# Patient Record
Sex: Female | Born: 1990 | Race: Black or African American | Hispanic: No | Marital: Single | Smoking: Current every day smoker
Health system: Southern US, Community
[De-identification: ages and names within clinical notes are randomized; demographics above are authoritative.]

## PROBLEM LIST (undated history)

## (undated) DIAGNOSIS — R519 Headache, unspecified: Secondary | ICD-10-CM

## (undated) DIAGNOSIS — R51 Headache: Secondary | ICD-10-CM

## (undated) HISTORY — DX: Headache: R51

## (undated) HISTORY — PX: WISDOM TOOTH EXTRACTION: SHX21

## (undated) HISTORY — DX: Headache, unspecified: R51.9

---

## 2018-07-23 NOTE — Congregational Nurse Program (Signed)
Congregational Nurse Program Note  Date of Encounter: 07/23/2018  Past Medical History: No past medical history on file.  Encounter Details: CNP Questionnaire - 08/06/18 1800      Questionnaire   Patient Status  Not Applicable    Race  Black or African American    Location Patient Served At  [facility]    Insurance  Private Insurance    Uninsured  Not Applicable    Food  No food insecurities    Housing/Utilities  Yes, have permanent housing    Transportation  No transportation needs    Interpersonal Safety  Yes, feel physically and emotionally safe where you currently live    Medication  No medication insecurities    Medical Provider  Yes    Referrals  Urgent Care    ED Visit Averted  Yes    Life-Saving Intervention Made  Not Applicable       Client had some personal concerns and needed someone to listen and give some encouragement. I provided this as she needed to express herself. I expressed to her that  I was available and when ever she needs to talk. [address]Duey Liller RN BSN CN [facility]  PhD.. [phone]. Spiritual Care.Marland Kitchen..Marland Kitchen

## 2018-08-06 NOTE — Congregational Nurse Program (Signed)
Congregational Nurse Program Note  Date of Encounter: 08/04/2018  Past Medical History: No past medical history on file.  Encounter Details: CNP Questionnaire - 08/06/18 1800      Questionnaire   Patient Status  Not Applicable    Race  Black or African American    Location Patient Served At  [facility]    Insurance  Private Insurance    Uninsured  Not Applicable    Food  No food insecurities    Housing/Utilities  Yes, have permanent housing    Transportation  No transportation needs    Interpersonal Safety  Yes, feel physically and emotionally safe where you currently live    Medication  No medication insecurities    Medical Provider  Yes    Referrals  Urgent Care    ED Visit Averted  Yes    Life-Saving Intervention Made  Not Applicable      Client was having headaches and her left eye was red and swollen. She wanted to know if she should see MD. Because of her history I suggested that she see her MD or go to urgent care. She felt like she should go to urgent care. She plans to go . Hope that all goes well with her. Will follow up. [employer]ena Deveney Bayon RN BSN CN [facility] PhD. [phone].

## 2018-09-19 NOTE — Congregational Nurse Program (Signed)
  Dept: [phone]   Congregational Nurse Program Note  Date of Encounter: 09/19/2018  Past Medical History: No past medical history on file.  Encounter Details: CNP Questionnaire - 09/19/18 0950      Questionnaire   Patient Status  Not Applicable    Race  Black or African American    Location Patient Served At  [facility]    Uninsured  Not Applicable    Food  No food insecurities    Housing/Utilities  Yes, have permanent housing    Transportation  No transportation needs    Interpersonal Safety  Yes, feel physically and emotionally safe where you currently live    Medication  No medication insecurities    Medical Provider  Yes    Referrals  Not Applicable    ED Visit Averted  Not Applicable    Life-Saving Intervention Made  Not Applicable      Encounter involved Spiritual Care and support. Needed a listening ear. States she feels much better after talking with me.[address] RN BSN CN [facility] PhD, [phone]

## 2018-09-24 NOTE — Congregational Nurse Program (Signed)
  Dept: [phone]   Congregational Nurse Program Note  Date of Encounter: 09/24/2018  Past Medical History: No past medical history on file.  Encounter Details: CNP Questionnaire - 09/24/18 1920      Questionnaire   Patient Status  Not Applicable    Race  Black or African American    Location Patient Served At  [facility]    Uninsured  Not Applicable    Food  No food insecurities    Housing/Utilities  Yes, have permanent housing    Transportation  No transportation needs    Interpersonal Safety  Yes, feel physically and emotionally safe where you currently live    Medication  No medication insecurities    Medical Provider  Yes    Referrals  Not Applicable    ED Visit Averted  Not Applicable    Life-Saving Intervention Made  Not Applicable      Encounter , Client miss taking BP medication and was concern about BP. I took BP for her, 131/91. I encouraged her to please make sure she take her medication as her MD prescribed . Will continue to check with her about this. [employer] BSN CN [facility] PhD. [phone].

## 2019-01-14 ENCOUNTER — Emergency Department (HOSPITAL_COMMUNITY): Payer: Managed Care, Other (non HMO)

## 2019-01-14 ENCOUNTER — Encounter: Payer: Self-pay | Admitting: Emergency Medicine

## 2019-01-14 ENCOUNTER — Emergency Department (HOSPITAL_COMMUNITY)
Admission: EM | Admit: 2019-01-14 | Discharge: 2019-01-14 | Disposition: A | Discharge status: Home / Self Care | Payer: Managed Care, Other (non HMO) | Attending: Emergency Medicine | Admitting: Emergency Medicine

## 2019-01-14 DIAGNOSIS — E236 Other disorders of pituitary gland: Secondary | ICD-10-CM | POA: Insufficient documentation

## 2019-01-14 DIAGNOSIS — R200 Anesthesia of skin: Secondary | ICD-10-CM | POA: Diagnosis present

## 2019-01-14 DIAGNOSIS — R51 Headache: Secondary | ICD-10-CM | POA: Diagnosis not present

## 2019-01-14 DIAGNOSIS — R519 Headache, unspecified: Secondary | ICD-10-CM

## 2019-01-14 LAB — CBC WITH DIFFERENTIAL/PLATELET
Abs Immature Granulocytes: 0.04 10*3/uL (ref 0.00–0.07)
Basophils Absolute: 0 10*3/uL (ref 0.0–0.1)
Basophils Relative: 0 %
EOS ABS: 0.1 10*3/uL (ref 0.0–0.5)
Eosinophils Relative: 1 %
HCT: 39.7 % (ref 36.0–46.0)
Hemoglobin: 13.1 g/dL (ref 12.0–15.0)
Immature Granulocytes: 1 %
Lymphocytes Relative: 39 %
Lymphs Abs: 3.2 10*3/uL (ref 0.7–4.0)
MCH: 27.8 pg (ref 26.0–34.0)
MCHC: 33 g/dL (ref 30.0–36.0)
MCV: 84.1 fL (ref 80.0–100.0)
Monocytes Absolute: 0.7 10*3/uL (ref 0.1–1.0)
Monocytes Relative: 8 %
Neutro Abs: 4.2 10*3/uL (ref 1.7–7.7)
Neutrophils Relative %: 51 %
PLATELETS: 402 10*3/uL — AB (ref 150–400)
RBC: 4.72 MIL/uL (ref 3.87–5.11)
RDW: 14.6 % (ref 11.5–15.5)
WBC: 8.2 10*3/uL (ref 4.0–10.5)
nRBC: 0 % (ref 0.0–0.2)

## 2019-01-14 LAB — COMPREHENSIVE METABOLIC PANEL
ALT: 14 U/L (ref 0–44)
ANION GAP: 11 (ref 5–15)
AST: 22 U/L (ref 15–41)
Albumin: 3.9 g/dL (ref 3.5–5.0)
Alkaline Phosphatase: 54 U/L (ref 38–126)
BUN: 12 mg/dL (ref 6–20)
CO2: 25 mmol/L (ref 22–32)
Calcium: 9.5 mg/dL (ref 8.9–10.3)
Chloride: 102 mmol/L (ref 98–111)
Creatinine, Ser: 0.95 mg/dL (ref 0.44–1.00)
GFR calc Af Amer: >60 mL/min (ref >60)
GFR calc non Af Amer: >60 mL/min (ref >60)
Glucose, Bld: 97 mg/dL (ref 70–99)
Potassium: 3.4 mmol/L — ABNORMAL LOW (ref 3.5–5.1)
Sodium: 138 mmol/L (ref 135–145)
Total Bilirubin: 0.7 mg/dL (ref 0.3–1.2)
Total Protein: 7.2 g/dL (ref 6.5–8.1)

## 2019-01-14 LAB — I-STAT TROPONIN, ED: TROPONIN I, POC: 0 ng/mL (ref 0.00–0.08)

## 2019-01-14 LAB — I-STAT BETA HCG BLOOD, ED (MC, WL, AP ONLY): I-stat hCG, quantitative: <5 m[IU]/mL (ref <5)

## 2019-01-14 MED ORDER — GADOBUTROL 1 MMOL/ML IV SOLN
8.5000 mL | Freq: Once | INTRAVENOUS | Status: AC | PRN
  Administered 2019-01-14: 8.5 mL via INTRAVENOUS

## 2019-01-14 NOTE — ED Provider Notes (Signed)
[facility]   CSN: [id number] Arrival date & time: 01/14/19  1059     History   Chief Complaint Chief Complaint  Patient presents with  . Numbness    right sided/headache    HPI Kristi BabaJessica Conley is a 28 y.o. female.  HPI  Yesterday had an episode between 1130-12PM nausea, generalized weakness, fatigue, trouble walking then where she had right sided face and arm. Lasted 15-6020min then went to bed and woke up and felt better, just fatigue generalized weakness.  No numbness at that. Also had headache then, 5-6 days in a row, pressure like headache.  Then this morning when woke up felt it in face, arm, leg and side on right, now it's coming and going but having some continuing tingling in the right leg and right arm.  On way here it was neck and side of face.  Still having dull headache. No photo or phonophobia.  Nausea yesterday, no vomiting.    Chest pain, sharp pain then went away after seconds.  Dyspnea lasted 15-20 min. Maybe felt anxious in the days leading up to it, hx of anxiety. Was having symptoms then felt anxious from it  Htn, hctz  Smokes black and milds. Fam hx of dm, htn. No known fam hx of early CAD or CVA No trauma or falls   PMH: HTN    OB History   No obstetric history on file.      Home Medications    Prior to Admission medications   Not on File    Family History No family history on file.  Social History Social History   Tobacco Use  . Smoking status: Not on file  Substance Use Topics  . Alcohol use: Not on file  . Drug use: Not on file     Allergies   Patient has no allergy information on record.   Review of Systems Review of Systems  Constitutional: Positive for fatigue. Negative for fever.  HENT: Negative for sore throat.   Eyes: Negative for visual disturbance.  Respiratory: Positive for shortness of breath. Negative for cough.   Cardiovascular: Positive for chest pain.    Gastrointestinal: Positive for nausea. Negative for abdominal pain and vomiting.  Genitourinary: Negative for difficulty urinating.  Musculoskeletal: Positive for gait problem (yesterday). Negative for back pain and neck pain.  Skin: Negative for rash.  Neurological: Positive for dizziness, numbness and headaches. Negative for syncope, facial asymmetry and weakness.  Psychiatric/Behavioral: Positive for decreased concentration.     Physical Exam Updated Vital Signs BP 134/63 (BP Location: Right Arm)   Pulse 74   Temp (!) 97.4 F (36.3 C) (Oral)   Resp 14   Ht 5\' 2"  (1.575 m)   Wt 86.2 kg   LMP 11/23/2018   SpO2 99%   BMI 34.75 kg/m   Physical Exam Vitals signs and nursing note reviewed.  Constitutional:      General: She is not in acute distress.    Appearance: She is well-developed. She is not diaphoretic.  HENT:     Head: Normocephalic and atraumatic.  Eyes:     General: No visual field deficit.    Conjunctiva/sclera: Conjunctivae normal.  Neck:     Musculoskeletal: Normal range of motion.  Cardiovascular:     Rate and Rhythm: Normal rate and regular rhythm.     Heart sounds: Normal heart sounds. No murmur. No friction rub. No gallop.   Pulmonary:     Effort: Pulmonary  effort is normal. No respiratory distress.     Breath sounds: Normal breath sounds. No wheezing or rales.  Abdominal:     General: There is no distension.     Palpations: Abdomen is soft.     Tenderness: There is no abdominal tenderness. There is no guarding.  Musculoskeletal:        General: No tenderness.  Skin:    General: Skin is warm and dry.     Findings: No erythema or rash.  Neurological:     Mental Status: She is alert and oriented to person, place, and time.     GCS: GCS eye subscore is 4. GCS verbal subscore is 5. GCS motor subscore is 6.     Cranial Nerves: No cranial nerve deficit, dysarthria or facial asymmetry.     Sensory: Sensory deficit present.     Motor: Motor function is  intact.     Coordination: Finger-Nose-Finger Test normal.      ED Treatments / Results  Labs (all labs ordered are listed, but only abnormal results are displayed) Labs Reviewed  CBC WITH DIFFERENTIAL/PLATELET - Abnormal; Notable for the following components:      Result Value   Platelets 402 (*)    All other components within normal limits  COMPREHENSIVE METABOLIC PANEL - Abnormal; Notable for the following components:   Potassium 3.4 (*)    All other components within normal limits  I-STAT BETA HCG BLOOD, ED ([facility], [facility], [facility] ONLY)  I-STAT TROPONIN, ED    EKG EKG Interpretation  Date/Time:  Wednesday January 14 2019 13:43:13 EST Ventricular Rate:  70 PR Interval:    QRS Duration: 76 QT Interval:  383 QTC Calculation: 414 R Axis:   59 Text Interpretation:  Sinus rhythm No significant change since last tracing Confirmed by Alvira Monday ([id number]) on 01/14/2019 3:17:36 PM   Radiology Dg Chest 2 View  Result Date: 01/14/2019 CLINICAL DATA:  Hypertension.  Headache with facial numbness EXAM: CHEST - 2 VIEW COMPARISON:  None. FINDINGS: Lungs are clear. Heart size and pulmonary vascularity are normal. No adenopathy. No bone lesions. IMPRESSION: No edema or consolidation. Electronically Signed   By: Bretta Bang III M.D.   On: 01/14/2019 13:38   Mr Laqueta Jean And Wo Contrast  Result Date: 01/14/2019 CLINICAL DATA:  Anterior headache for 4 days. Left jaw/tooth pain. Nausea and right-sided numbness beginning yesterday. Similar symptoms in the past. EXAM: MRI HEAD WITHOUT AND WITH CONTRAST TECHNIQUE: Multiplanar, multiecho pulse sequences of the brain and surrounding structures were obtained without and with intravenous contrast. CONTRAST:  8.5 mL Gadavist COMPARISON:  None. FINDINGS: Brain: There is no evidence of acute infarct, intracranial hemorrhage, mass, midline shift, or extra-axial fluid collection. The ventricles and sulci are normal. The brain is normal in signal. No abnormal  enhancement is identified. There is a mildly expanded partially empty sella. Vascular: Major intracranial vascular flow voids are preserved. Skull and upper cervical spine: No suspicious calvarial lesion. Diffusely decreased bone marrow T1 signal intensity in the visualized upper cervical spine, nonspecific though can be seen with anemia, smoking, and obesity. Sinuses/Orbits: Unremarkable orbits. Paranasal sinuses and mastoid air cells are clear. Other: None. IMPRESSION: 1. Partially empty sella, often an incidental finding though can be seen with idiopathic intracranial hypertension. 2. Otherwise unremarkable appearance of the brain. No acute intracranial abnormality. Electronically Signed   By: Sebastian Ache M.D.   On: 01/14/2019 15:38    Procedures Procedures (including critical care time)  Medications Ordered in ED  Medications  gadobutrol (GADAVIST) 1 MMOL/ML injection 8.5 mL (8.5 mLs Intravenous Contrast Given 01/14/19 1525)     Initial Impression / Assessment and Plan / ED Course  I have reviewed the triage vital signs and the nursing notes.  Pertinent labs & imaging results that were available during my care of the patient were reviewed by me and considered in my medical decision making (see chart for details).     28yo female with htn presents with concern for right sided face, arm, and leg tingling and headache.    No significant electrolyte problems. Reported brief CP, labs and ECG WNL. Do not suspect dissection given description of pain, normal pulses.   MR WWO contrast performed showing no evidence of MS or CVA. Doubt symptoms represent TIAs and do not feel she requires further admission for this evaluation.   MRI does show partial empty sella. Recommend follow up with PCP and will refer to Neurology. Consider benign intracranial hypertension, but do not see indication for emergent LP. Patient discharged in stable condition with understanding of reasons to return.   Final Clinical  Impressions(s) / ED Diagnoses   Final diagnoses:  Right sided numbness  Acute nonintractable headache, unspecified headache type  Empty sella [facility])    ED Discharge Orders    None       Alvira Monday, MD 01/14/19 2219

## 2019-01-14 NOTE — ED Notes (Signed)
Patient transported to X-ray 

## 2019-01-14 NOTE — ED Notes (Signed)
Patient transported to MRI 

## 2019-01-14 NOTE — ED Triage Notes (Signed)
Pt arrives POV from home c/o anterior headache for the past 4 days, then started to have left jaw pain/tooth ache. Pt states yesterday she started to feel nauseous and right sided numbness from face down to her leg. Pt states that this has happened before. Pt has hx of hypertension. Pt a&o x4. VSS at this time.

## 2019-01-14 NOTE — ED Notes (Signed)
Pt given discharge instructions. Pt given follow up information and given the opportunity to ask questions. Pt verbalized understanding.

## 2019-01-14 NOTE — ED Notes (Signed)
Pt returned from xray

## 2019-01-15 ENCOUNTER — Encounter: Payer: Self-pay | Admitting: Neurology

## 2019-03-27 NOTE — Progress Notes (Signed)
Virtual Visit via Video Note The purpose of this virtual visit is to provide medical care while limiting exposure to the novel coronavirus.    Consent was obtained for video visit:  Yes.   Answered questions that patient had about telehealth interaction:  Yes.   I discussed the limitations, risks, security and privacy concerns of performing an evaluation and management service by telemedicine. I also discussed with the patient that there may be a patient responsible charge related to this service. The patient expressed understanding and agreed to proceed.  Pt location: Home Physician Location: office Name of referring provider:  Alvira Monday, MD I connected with Kristi Conley at patients initiation/request on 03/30/2019 at 11:10 AM EDT by video enabled telemedicine application and verified that I am speaking with the correct person using two identifiers. Pt MRN:  [id number] Pt DOB:  [birth year] Video Participants:  Kristi Conley   History of Present Illness:  Kristi Conley is a 28 year old woman who presents for headache and right sided numbness.  History supplemented by ED note.  On 01/10/19, she developed a headache.  She described it as a mild-moderate sinus pressure across the forehead as well as a moderate shooting pain down the left jaw.  It occurred off and on for the entire day.  There was some phonophobia but no nausea, vomiting, phonophobia, visual disturbance or unilateral numbness or weakness.  This occurred for the next two days.  On 01/13/19, she suddenly developed blurred vision, right sided facial numbness, severe weakness, nausea and loud bilateral non-pulsatile tinnitus in both ears.  She managed to lay down in bed.  She felt so weak that she couldn't move.  This lasted about 10 to 15 minutes and it resolved.  The next morning she began feeling the right sided facial numbness again, so she presented to the ED for further evaluation.  MRI of brain with and without contrast showed  partial empty sella but otherwise unremarkable.  Since then, she has had one minor recurrent episode about 1 1/2 weeks ago, presenting only as some weakness and ringing in the ears that lasted 10 minutes.  She had one headache (only sinus pressure but not jaw pain) about a week ago.  The headaches and spells occurred when she would smoke cigars.  Rest helped relieve symptoms.  Preceding onset, she reported some increased depression and anxiety.  She had stopped sertraline a year ago but restarted it last week.     She does have past history of migraines since 3rd or 4th grade.  They are pounding headaches associated with photophobia and phonophobia and respond to OTC analgesics such as ibuprofen.  Initially they were frequent.  More recently, they only occur once in a while.  Current medications: Sertaline 25mg  daily HCTZ 25mg  Ibuprofen 200mg   Caffeine:  No coffee or soda. Exercise:  Routine 4-5 days a week Depression:  Yes; Anxiety:  Yes Sleep:  Good  Past Medical History: Past Medical History:  Diagnosis Date  . Headache     Medications: HCTZ 25mg  daily Sertraline 25mg  daily Ibuprofen  Allergies: No known allergies  Family History: No family history of migraines  Social History: Social History   Socioeconomic History  . Marital status: Single    Spouse name: Not on file  . Number of children: Not on file  . Years of education: Not on file  . Highest education level: Not on file  Occupational History  . Not on file  Social Needs  . Financial  resource strain: Not on file  . Food insecurity:    Worry: Not on file    Inability: Not on file  . Transportation needs:    Medical: Not on file    Non-medical: Not on file  Tobacco Use  . Smoking status: Not on file  Substance and Sexual Activity  . Alcohol use: Not on file  . Drug use: Not on file  . Sexual activity: Not on file  Lifestyle  . Physical activity:    Days per week: Not on file    Minutes per session:  Not on file  . Stress: Not on file  Relationships  . Social connections:    Talks on phone: Not on file    Gets together: Not on file    Attends religious service: Not on file    Active member of club or organization: Not on file    Attends meetings of clubs or organizations: Not on file    Relationship status: Not on file  . Intimate partner violence:    Fear of current or ex partner: Not on file    Emotionally abused: Not on file    Physically abused: Not on file    Forced sexual activity: Not on file  Other Topics Concern  . Not on file  Social History Narrative  . Not on file   Observations/Objective:   Pulse 80, height 5\' 2"  (1.575 m), weight 185 lb (83.9 kg). Alert and oriented.  Speech fluent and not dysarthric.  Language intact.  Face symmetric.    Assessment and Plan:   1.  Migraine with aura, without status migrainosus, not intractable.  MRI of brain normal except for partial empty sella, which I believe to be an incidental finding.  Still, it may prudent to get a formal eye exam. 2.  Depression and anxiety 3.  Tobacco use disorder  1.  She was just started on sertraline.  Treatment of depression and anxiety may decrease frequency of migraines.  Also, sertraline may help directly reduce frequency of migraines as well. 2.  For abortive therapy, ibuprofen or Excedrin 3.  Limit use of pain relievers to no more than 2 days out of week to prevent risk of rebound or medication-overuse headache. 4.  Keep headache diary 5.  Exercise, hydration, caffeine cessation, sleep hygiene, monitor for and avoid triggers 6.  Consider:  magnesium citrate 400mg  daily, riboflavin 400mg  daily, and coenzyme Q10 100mg  three times daily 7.  Follow up as needed  Follow Up Instructions:    -I discussed the assessment and treatment plan with the patient. The patient was provided an opportunity to ask questions and all were answered. The patient agreed with the plan and demonstrated an  understanding of the instructions.   The patient was advised to call back or seek an in-person evaluation if the symptoms worsen or if the condition fails to improve as anticipated.   Cira ServantAdam Robert Jaffe, DO

## 2019-03-30 ENCOUNTER — Encounter: Payer: Self-pay | Admitting: Neurology

## 2019-03-30 ENCOUNTER — Other Ambulatory Visit: Payer: Self-pay

## 2019-03-30 ENCOUNTER — Telehealth (INDEPENDENT_AMBULATORY_CARE_PROVIDER_SITE_OTHER): Payer: Managed Care, Other (non HMO) | Admitting: Neurology

## 2019-03-30 VITALS — HR 80 | Ht 62.0 in | Wt 185.0 lb

## 2019-03-30 DIAGNOSIS — G43109 Migraine with aura, not intractable, without status migrainosus: Secondary | ICD-10-CM

## 2019-03-30 DIAGNOSIS — F419 Anxiety disorder, unspecified: Secondary | ICD-10-CM | POA: Diagnosis not present

## 2019-03-30 DIAGNOSIS — F32A Depression, unspecified: Secondary | ICD-10-CM

## 2019-03-30 DIAGNOSIS — F17200 Nicotine dependence, unspecified, uncomplicated: Secondary | ICD-10-CM | POA: Diagnosis not present

## 2019-03-30 DIAGNOSIS — F329 Major depressive disorder, single episode, unspecified: Secondary | ICD-10-CM

## 2019-03-30 NOTE — Patient Instructions (Addendum)
1.  You were just started on sertraline.  Treatment of depression and anxiety may decrease frequency of migraines.  Also, sertraline may help directly reduce frequency of migraines as well. 2.  For abortive therapy, ibuprofen or Excedrin 3.  Limit use of pain relievers to no more than 2 days out of week to prevent risk of rebound or medication-overuse headache. 4.  Keep headache diary 5.  Exercise, hydration, caffeine cessation, sleep hygiene, monitor for and avoid triggers 6.  Consider:  magnesium citrate 400mg  daily, riboflavin 400mg  daily, and coenzyme Q10 100mg  three times daily 7. Quit smoking 8. Please get a formal eye exam   9. Follow up as needed

## 2020-02-04 IMAGING — MR MR HEAD WO/W CM
10 of 13 series · 30 of 48 positions shown · IV contrast (gadavist)
Comparison: None.

CLINICAL DATA: Anterior headache for 4 days. Left jaw/tooth pain.
Nausea and right-sided numbness beginning yesterday. Similar
symptoms in the past.

EXAM:
MRI HEAD WITHOUT AND WITH CONTRAST
TECHNIQUE: Multiplanar, multiecho pulse sequences of the brain and surrounding
structures were obtained without and with intravenous contrast.
CONTRAST:  8.5 mL Gadavist

[Series 4: DWI · axial · 3.0mm · 0.94mm/px · z∈[-61,+82]mm · 6 of 100 slices shown (1 of 2)]
[im 1/100]
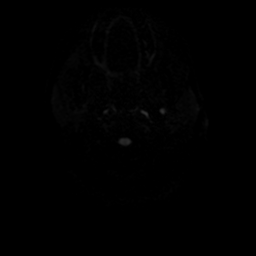
[im 20/100]
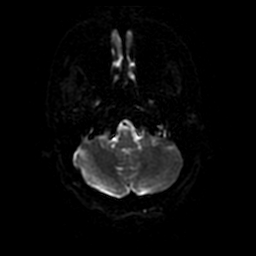
[im 40/100]
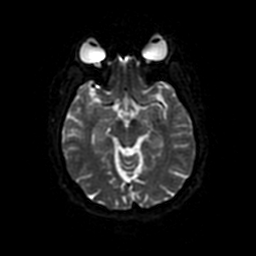
[im 60/100]
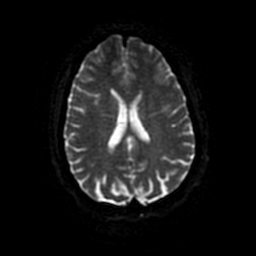
[im 80/100]
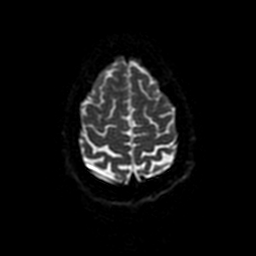
[im 100/100]
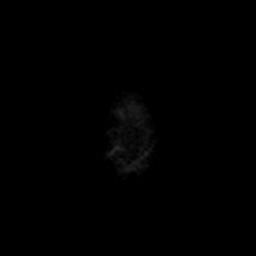

[Series 5: DWI · coronal · 4.0mm · 0.94mm/px · 5 of 72 slices shown (2 of 2)]
[im 1/72]
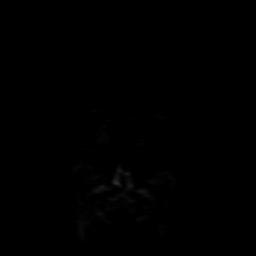
[im 18/72]
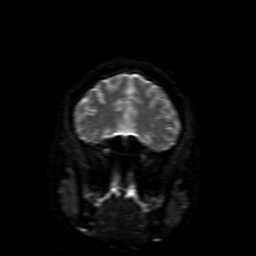
[im 36/72]
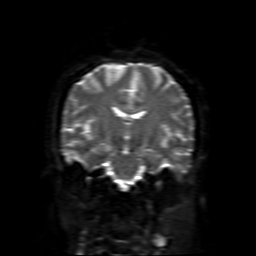
[im 54/72]
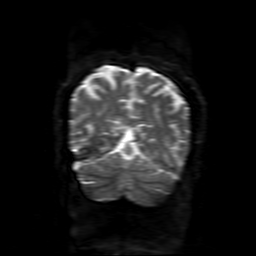
[im 72/72]
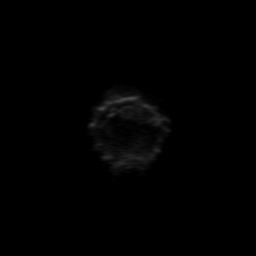

[Series 6: FLAIR · sagittal · 5.0mm · 0.47mm/px · 2 of 26 slices shown (1 of 2)]
[im 1/26]
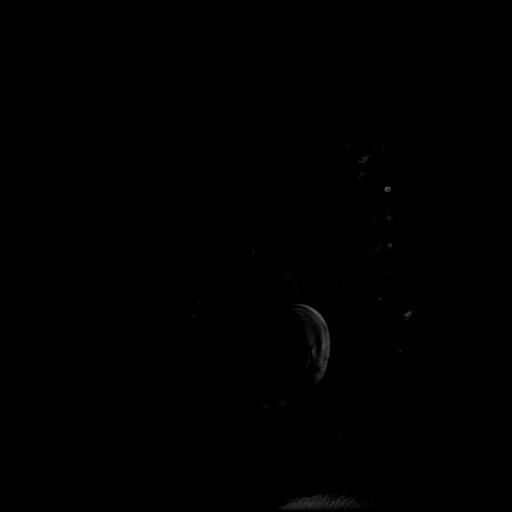
[im 26/26]
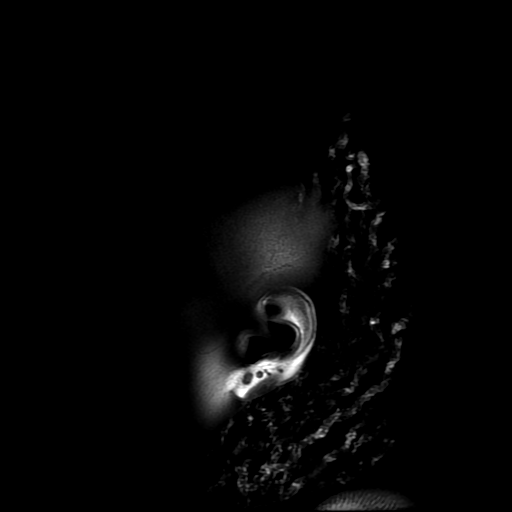

[Series 7: T2 · axial · 5.0mm · 0.47mm/px · z∈[-61,+79]mm · 2 of 25 slices shown]
[im 1/25]
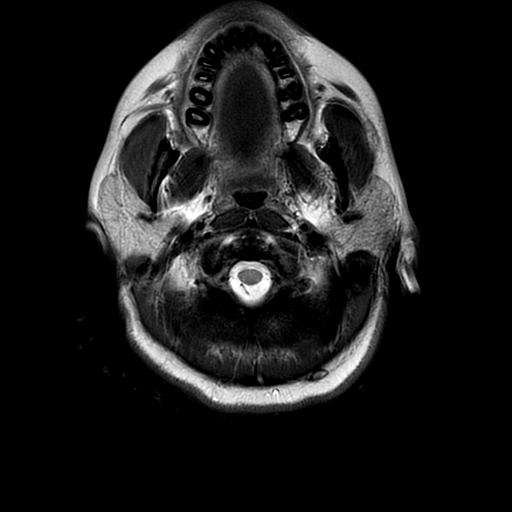
[im 25/25]
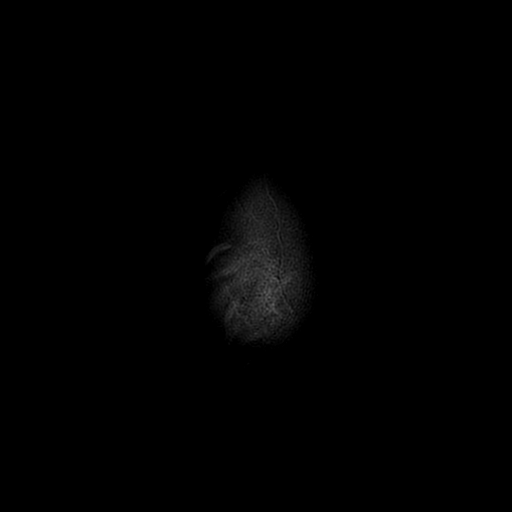

[Series 8: FLAIR · axial · 3.0mm · 0.45mm/px · z∈[-55,+85]mm · 2 of 25 slices shown (2 of 2)]
[im 1/25]
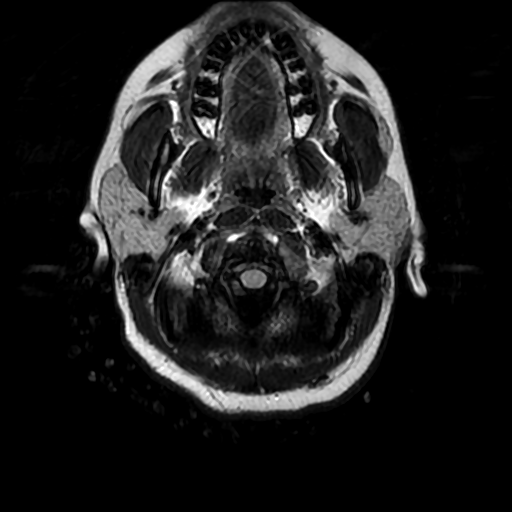
[im 25/25]
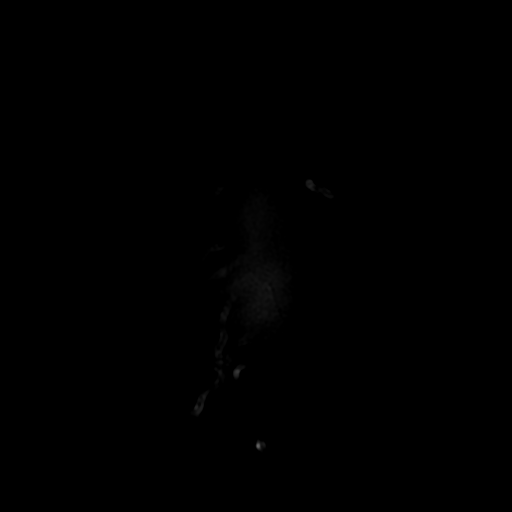

[Series 9: (person_name) · axial · 3.0mm · 0.47mm/px · z∈[-62,+9]mm · 4 of 100 slices shown]
[im 1/100]
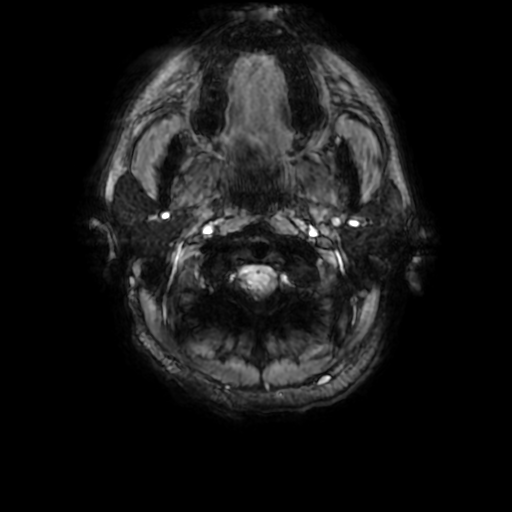
[im 17/100]
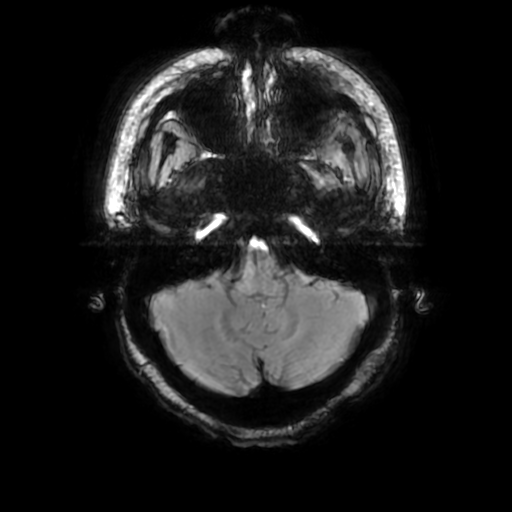
[im 34/100]
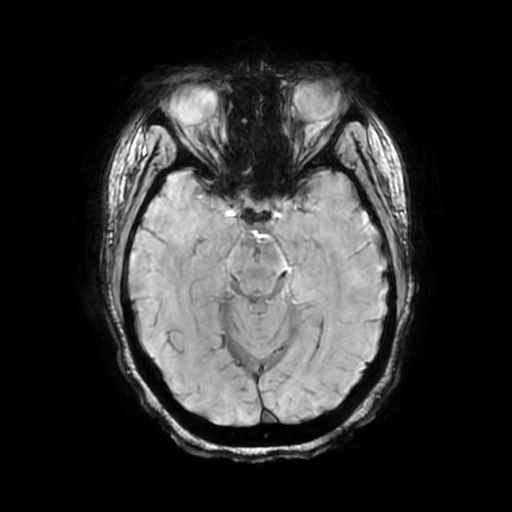
[im 50/100]
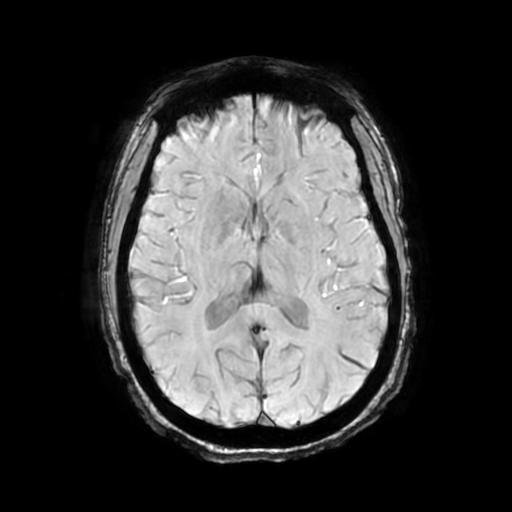

[Series 11: T2 post-contrast · coronal · 5.0mm · 0.39mm/px · 2 of 30 slices shown]
[im 1/30]
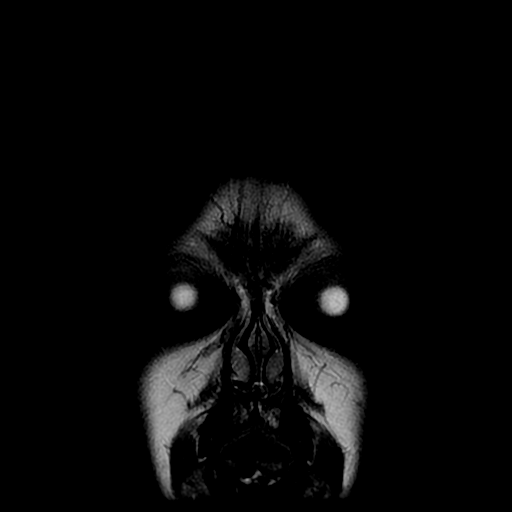
[im 30/30]
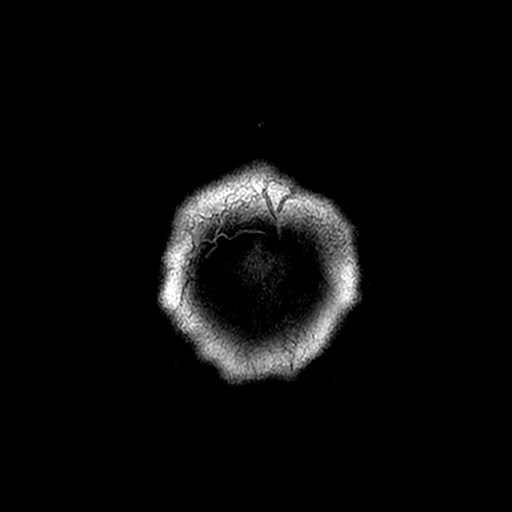

[Series 13: T1 · coronal · 5.0mm · 0.39mm/px · 2 of 30 slices shown]
[im 1/30]
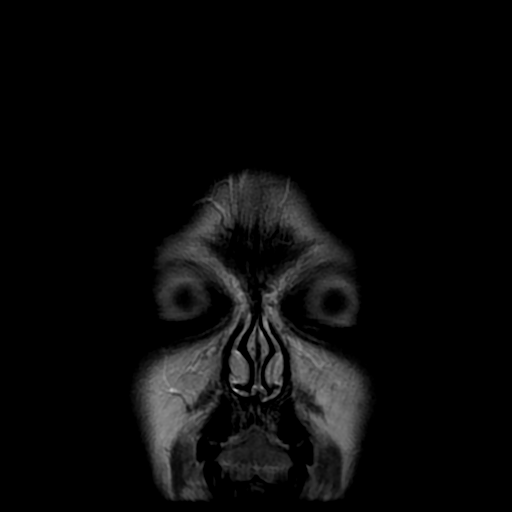
[im 30/30]
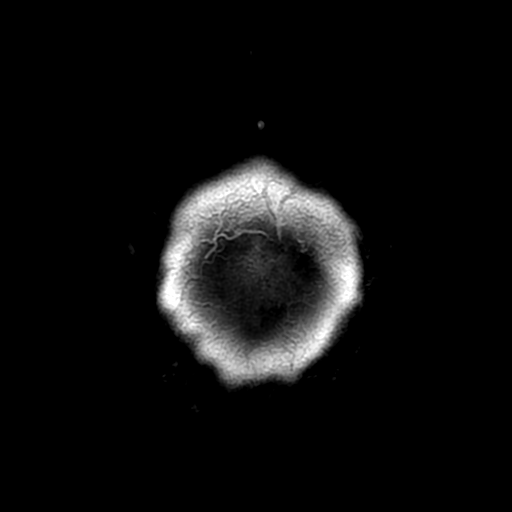

[Series 450: ADC · axial · 3.0mm · 0.94mm/px · z∈[-61,+82]mm · 3 of 50 slices shown (1 of 2)]
[im 1/50]
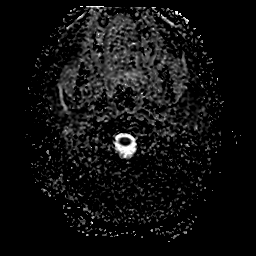
[im 25/50]
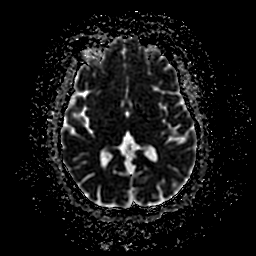
[im 50/50]
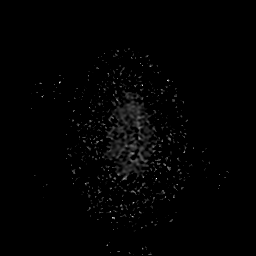

[Series 550: ADC · coronal · 4.0mm · 0.94mm/px · 2 of 36 slices shown (2 of 2)]
[im 1/36]
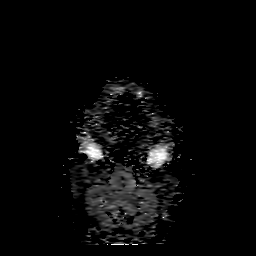
[im 36/36]
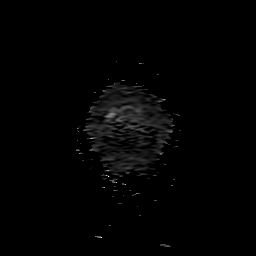

[30 of 48 positions shown; findings below may reference images not displayed]

FINDINGS: Brain: There is no evidence of acute infarct, intracranial
hemorrhage, mass, midline shift, or extra-axial fluid collection.
The ventricles and sulci are normal. The brain is normal in signal.
No abnormal enhancement is identified. There is a mildly expanded
partially empty sella.

Vascular: Major intracranial vascular flow voids are preserved.

Skull and upper cervical spine: No suspicious calvarial lesion.
Diffusely decreased bone marrow T1 signal intensity in the
visualized upper cervical spine, nonspecific though can be seen with
anemia, smoking, and obesity.

Sinuses/Orbits: Unremarkable orbits. Paranasal sinuses and mastoid
air cells are clear.

Other: None.
IMPRESSION: 1. Partially empty sella, often an incidental finding though can be
seen with idiopathic intracranial hypertension.
2. Otherwise unremarkable appearance of the brain. No acute
intracranial abnormality.
